# Patient Record
Sex: Male | Born: 1980 | Race: Asian | Hispanic: No | Marital: Married | Smoking: Former smoker
Health system: Southern US, Community
[De-identification: ages and names within clinical notes are randomized; demographics above are authoritative.]

## PROBLEM LIST (undated history)

## (undated) HISTORY — PX: LASIK: SHX215

---

## 2016-06-30 DIAGNOSIS — Z3189 Encounter for other procreative management: Secondary | ICD-10-CM | POA: Diagnosis not present

## 2016-07-28 DIAGNOSIS — Z3189 Encounter for other procreative management: Secondary | ICD-10-CM | POA: Diagnosis not present

## 2016-09-22 DIAGNOSIS — Z3189 Encounter for other procreative management: Secondary | ICD-10-CM | POA: Diagnosis not present

## 2017-03-16 DIAGNOSIS — J029 Acute pharyngitis, unspecified: Secondary | ICD-10-CM | POA: Diagnosis not present

## 2017-03-23 DIAGNOSIS — J209 Acute bronchitis, unspecified: Secondary | ICD-10-CM | POA: Diagnosis not present

## 2017-03-23 DIAGNOSIS — R05 Cough: Secondary | ICD-10-CM | POA: Diagnosis not present

## 2018-05-11 DIAGNOSIS — B349 Viral infection, unspecified: Secondary | ICD-10-CM | POA: Diagnosis not present

## 2018-07-25 ENCOUNTER — Emergency Department (HOSPITAL_COMMUNITY)
Admission: EM | Admit: 2018-07-25 | Discharge: 2018-07-25 | Disposition: A | Discharge status: Home / Self Care | Payer: BLUE CROSS/BLUE SHIELD | Attending: Emergency Medicine | Admitting: Emergency Medicine

## 2018-07-25 ENCOUNTER — Encounter (HOSPITAL_COMMUNITY): Payer: Self-pay | Admitting: *Deleted

## 2018-07-25 ENCOUNTER — Emergency Department (HOSPITAL_COMMUNITY): Payer: BLUE CROSS/BLUE SHIELD

## 2018-07-25 DIAGNOSIS — R05 Cough: Secondary | ICD-10-CM | POA: Insufficient documentation

## 2018-07-25 DIAGNOSIS — R059 Cough, unspecified: Secondary | ICD-10-CM

## 2018-07-25 MED ORDER — PSEUDOEPH-BROMPHEN-DM 30-2-10 MG/5ML PO SYRP: 5.0000 mL | ORAL_SOLUTION | Freq: Four times a day (QID) | ORAL | 0 refills | Status: AC | PRN

## 2018-07-25 NOTE — ED Notes (Signed)
Bed: [facility]2 Expected date:  Expected time:  Means of arrival:  Comments: 

## 2018-07-25 NOTE — Discharge Instructions (Addendum)
Your lungs are clear on your chest x-ray.  I am going to prescribe you some medication which should help.  I am going to give you information to follow-up with a primary care doctor for further work-up if the medication does not help.  Thank you for allowing me to care for you today. Please return to the emergency department if you have new or worsening symptoms. Take your medications as instructed.

## 2018-07-25 NOTE — ED Triage Notes (Signed)
Pt complains of productive cough x 3 weeks. Pt denies fever, shortness of breath.

## 2018-07-25 NOTE — ED Provider Notes (Signed)
[facility]-EMERGENCY DEPT Provider Note   CSN: [id number] Arrival date & time: 07/25/18  1728    History   Chief Complaint Chief Complaint  Patient presents with  . Cough    HPI Travis Patel is a 38 y.o. male.     Patient presents the emergency department for cough.  Patient has no past medical history.  Reports that he has had a cough for about 3 weeks.  Reports that he tried some over-the-counter medications but the cough persisted.  He reports that sometimes it is productive of some mucus but usually is dry.  Reports that he does not have a primary care doctor.  States that around this time every year he gets the same symptoms and has been diagnosed with bronchitis before.  He denies smoking.  Denies any fever, chills, shortness of breath, leg swelling, chest pain, sick contacts, recent travel.     History reviewed. No pertinent past medical history.  There are no active problems to display for this patient.   History reviewed. No pertinent surgical history.      Home Medications    Prior to Admission medications   Medication Sig Start Date End Date Taking? Authorizing Provider  brompheniramine-pseudoephedrine-DM 30-2-10 MG/5ML syrup Take 5 mLs by mouth 4 (four) times daily as needed for up to 7 days. 07/25/18 08/01/18  Arlyn Dunning, PA-C    Family History No family history on file.  Social History Social History   Tobacco Use  . Smoking status: Never Smoker  . Smokeless tobacco: Never Used  Substance Use Topics  . Alcohol use: Yes  . Drug use: Not on file     Allergies   Patient has no allergy information on record.   Review of Systems Review of Systems  Constitutional: Negative for chills and fever.  HENT: Negative for congestion, ear pain, postnasal drip, rhinorrhea, sinus pain and sore throat.   Eyes: Negative for pain and visual disturbance.  Respiratory: Positive for cough. Negative for shortness of breath.    Cardiovascular: Negative for chest pain and palpitations.  Gastrointestinal: Negative for abdominal pain and vomiting.  Genitourinary: Negative for dysuria and hematuria.  Musculoskeletal: Negative for arthralgias and back pain.  Skin: Negative for color change and rash.  Neurological: Negative for seizures and syncope.  All other systems reviewed and are negative.    Physical Exam Updated Vital Signs BP (!) 137/101 (BP Location: Right Arm)   Pulse 88   Temp 98.2 F (36.8 C) (Oral)   Resp 18   SpO2 98%   Physical Exam Vitals signs and nursing note reviewed.  Constitutional:      Appearance: Normal appearance.  HENT:     Head: Normocephalic.     Nose: Nose normal.     Mouth/Throat:     Mouth: Mucous membranes are moist.     Pharynx: Oropharynx is clear. No oropharyngeal exudate or posterior oropharyngeal erythema.  Eyes:     Conjunctiva/sclera: Conjunctivae normal.  Cardiovascular:     Rate and Rhythm: Normal rate and regular rhythm.  Pulmonary:     Effort: Pulmonary effort is normal. No respiratory distress.     Breath sounds: Normal breath sounds. No wheezing, rhonchi or rales.  Chest:     Chest wall: No tenderness.  Skin:    General: Skin is dry.  Neurological:     Mental Status: He is alert.  Psychiatric:        Mood and Affect: Mood normal.  ED Treatments / Results  Labs (all labs ordered are listed, but only abnormal results are displayed) Labs Reviewed - No data to display  EKG None  Radiology Dg Chest Portable 1 View  Result Date: 07/25/2018 CLINICAL DATA:  Cough for 3 weeks EXAM: PORTABLE CHEST 1 VIEW COMPARISON:  None. FINDINGS: The heart size and mediastinal contours are within normal limits. Both lungs are clear. The visualized skeletal structures are unremarkable. IMPRESSION: Clear lungs. Electronically Signed   By: Deatra RobinsonKevin  Herman M.D.   On: 07/25/2018 19:11    Procedures Procedures (including critical care time)  Medications Ordered in  ED Medications - No data to display   Initial Impression / Assessment and Plan / ED Course  I have reviewed the triage vital signs and the nursing notes.  Pertinent labs & imaging results that were available during my care of the patient were reviewed by me and considered in my medical decision making (see chart for details).  Clinical Course as of Jul 24 1920  Tue Jul 25, 2018  [id number] Patient has had cough for 2 to 3 weeks.  No concerning findings on exam.  Negative chest x-ray.  I think this may actually be seasonal allergies.  I am going to give him Bromfed.  Follow-up with primary care doctor for further work-up if cough persists.   [KM]    Clinical Course User Index [KM] Arlyn DunningMcLean, Christopher Hink A, PA-C       Based on review of vitals, medical screening exam, lab work and/or imaging, there does not appear to be an acute, emergent etiology for the patient's symptoms. Counseled pt on good return precautions and encouraged both PCP and ED follow-up as needed.  Prior to discharge, I also discussed incidental imaging findings with patient in detail and advised appropriate, recommended follow-up in detail.  Clinical Impression: 1. Cough     Disposition: Discharge  Prior to providing a prescription for a controlled substance, I independently reviewed the patient's recent prescription history on the [place] Controlled Substance Reporting System. The patient had no recent or regular prescriptions and was deemed appropriate for a brief, less than 3 day prescription of narcotic for acute analgesia.  This note was prepared with assistance of Conservation officer, historic buildingsDragon voice recognition software. Occasional wrong-word or sound-a-like substitutions may have occurred due to the inherent limitations of voice recognition software.   Final Clinical Impressions(s) / ED Diagnoses   Final diagnoses:  Cough    ED Discharge Orders         Ordered    brompheniramine-pseudoephedrine-DM 30-2-10 MG/5ML syrup  4 times  daily PRN     07/25/18 1921           Jeral PinchMcLean, Dason Mosley A, PA-C 07/25/18 Clover [id number]    Rees, Elizabeth, MD 07/25/18 [phone]

## 2018-08-09 NOTE — Progress Notes (Signed)
Patient ID: Travis Patel, male   DOB: [birth year], 38 y.o.   MRN: [id number] Virtual Visit via Telephone Note  I connected with Travis Patel on 08/10/18 at  1:30 PM EDT by telephone and verified that I am speaking with the correct person using two identifiers.   I discussed the limitations, risks, security and privacy concerns of performing an evaluation and management service by telephone and the availability of in person appointments. I also discussed with the patient that there may be a patient responsible charge related to this service. The patient expressed understanding and agreed to proceed.  Patient location:  home My Location:  [facility] office Persons on the call:  Myself and the patient   History of Present Illness: After being seen in the ED 07/25/2018 for cough.  Cough was initially non productive and now he is coughing up green and brown mucus.  Xray of chest was WNL.  No fever.  Cough has been present now for about 6 weeks.  No breathing difficulties.  From ED note: Based on review of vitals, medical screening exam, lab work and/or imaging, there does not appear to be an acute, emergent etiology for the patient's symptoms. Counseled pt on good return precautions and encouraged both PCP and ED follow-up as needed.  Prior to discharge, I also discussed incidental imaging findings with patient in detail and advised appropriate, recommended follow-up in detail.  Clinical Impression: 1. Cough     Disposition: Discharge  Prior to providing a prescription for a controlled substance, I independently reviewed the patient's recent prescription history on the [place] Controlled Substance Reporting System. The patient had no recent or regular prescriptions and was deemed appropriate for a brief, less than 3 day prescription of narcotic for acute analgesia.     Observations/Objective:  A&Ox3.  TP linear.  Speech is clear   Assessment and Plan: 1. Upper respiratory tract infection,  unspecified type Fluids, rest, respiratory care.  Keep qurantine guidelines.  Cover for atypicals - azithromycin (ZITHROMAX) 250 MG tablet; Take 2 today then 1 daily  Dispense: 6 tablet; Refill: 0 - benzonatate (TESSALON) 100 MG capsule; Take 2 capsules (200 mg total) by mouth 3 (three) times daily as needed for cough.  Dispense: 40 capsule; Refill: 0  2.  Hospital follow-up   Follow Up Instructions: F/up 4-6 weeks;  Sooner if needed   I discussed the assessment and treatment plan with the patient. The patient was provided an opportunity to ask questions and all were answered. The patient agreed with the plan and demonstrated an understanding of the instructions.   The patient was advised to call back or seek an in-person evaluation if the symptoms worsen or if the condition fails to improve as anticipated.  I provided 11 minutes of non-face-to-face time during this encounter.   Georgian Co, PA-C

## 2018-08-10 ENCOUNTER — Ambulatory Visit: Payer: BLUE CROSS/BLUE SHIELD | Attending: Family Medicine | Admitting: Physician Assistant

## 2018-08-10 ENCOUNTER — Other Ambulatory Visit: Payer: Self-pay

## 2018-08-10 DIAGNOSIS — J069 Acute upper respiratory infection, unspecified: Secondary | ICD-10-CM | POA: Diagnosis not present

## 2018-08-10 DIAGNOSIS — Z09 Encounter for follow-up examination after completed treatment for conditions other than malignant neoplasm: Secondary | ICD-10-CM | POA: Diagnosis not present

## 2018-08-10 MED ORDER — AZITHROMYCIN 250 MG PO TABS: ORAL_TABLET | ORAL | 0 refills | Status: DC

## 2018-08-10 MED ORDER — BENZONATATE 100 MG PO CAPS: 200.0000 mg | ORAL_CAPSULE | Freq: Three times a day (TID) | ORAL | 0 refills | Status: DC | PRN

## 2018-08-10 NOTE — Progress Notes (Signed)
Called patient to initiate their telephone visit with provider Georgian Co, PA. Verified date of birth. Patient still having c/o productive cough x 6 weeks. Worsening. Cough is worse at night. Started with clear mucus but has transitioned to a dark green/brown mucuc Occasionally notices blood in his phlegm. KWalker, CMA.

## 2018-08-18 ENCOUNTER — Telehealth: Payer: Self-pay | Admitting: General Practice

## 2018-08-18 NOTE — Telephone Encounter (Signed)
Travis Patel, could you call to set him up for WebEx visit on Monday please.

## 2018-08-18 NOTE — Telephone Encounter (Signed)
Patients call returned.  Patient identified by name and date of birth.  Patient states he has a cough because he has excessive mucous.  Patient states cough is intermittent depending on mucous build up.  Patient denies and medical conditions.  Patient states he weighs 155#.  Patient denies any other symptoms.  Patient denies any COVID-19 symptoms.  Patient made an appointment for Monday at 1030hrs  Patient acknowledged understanding of advice.

## 2018-08-18 NOTE — Telephone Encounter (Signed)
Patient called stating he finished his Azithromycin about 5-6 days ago but his condition remains the same. Patient states he does not feel well. Patient states he still has cough and mucus. Please follow up.

## 2018-08-21 ENCOUNTER — Ambulatory Visit: Payer: BLUE CROSS/BLUE SHIELD | Attending: Nurse Practitioner | Admitting: Nurse Practitioner

## 2018-08-21 ENCOUNTER — Other Ambulatory Visit: Payer: Self-pay

## 2018-09-15 ENCOUNTER — Encounter: Payer: Self-pay | Admitting: Nurse Practitioner

## 2018-09-15 ENCOUNTER — Ambulatory Visit: Payer: BC Managed Care – PPO | Attending: Nurse Practitioner | Admitting: Nurse Practitioner

## 2018-09-15 ENCOUNTER — Other Ambulatory Visit: Payer: Self-pay

## 2018-09-15 DIAGNOSIS — R053 Chronic cough: Secondary | ICD-10-CM

## 2018-09-15 DIAGNOSIS — R05 Cough: Secondary | ICD-10-CM

## 2018-09-15 MED ORDER — ALBUTEROL SULFATE HFA 108 (90 BASE) MCG/ACT IN AERS: 2.0000 | INHALATION_SPRAY | Freq: Four times a day (QID) | RESPIRATORY_TRACT | 2 refills | Status: DC | PRN

## 2018-09-15 NOTE — Progress Notes (Signed)
Virtual Visit via Telephone Note Due to national recommendations of social distancing due to COVID 19, telehealth visit is felt to be most appropriate for this patient at this time.  I discussed the limitations, risks, security and privacy concerns of performing an evaluation and management service by telephone and the availability of in person appointments. I also discussed with the patient that there may be a patient responsible charge related to this service. The patient expressed understanding and agreed to proceed.    I connected with Langston Reusing on 09/15/18  at   2:30 PM EDT  EDT by telephone and verified that I am speaking with the correct person using two identifiers.   Consent I discussed the limitations, risks, security and privacy concerns of performing an evaluation and management service by telephone and the availability of in person appointments. I also discussed with the patient that there may be a patient responsible charge related to this service. The patient expressed understanding and agreed to proceed.   Location of Patient: Private  Residence    Location of Provider: [facility]    Persons participating in Telemedicine visit: Bertram Denver FNP-BC YY [place] CMA Langston Reusing    History of Present Illness: Telemedicine visit for: Establish care and chronic cough He has never seen a PCP   Cough: Patient complains of nonproductive cough, productive cough and itchy throat.  Symptoms began 3 months ago.  The cough varies in regard to being non-productive, without wheezing, dyspnea or hemoptysis and sometimes productive of yellow brownish this sputum which turns to clear as the day progresses. Patient does not have new pets. Patient does not have a history of asthma. Patient does not have a history of environmental allergens. Patient has not had recent travel. Patient is a former smoker. Cough is worsened with lying down.  He has tried prilosec in the  past but only for one week. Other medications tried include flonase, OTC allergy medications.  Has taken claritin in the morning and zyrtec at night as well as benadryl with no relief of symptoms.  He was recently 08-10-2018 tried on tesssalon and zpack which was not effective.  He states to me that he has not experienced these symptoms before however ED note 07-25-2018 states "patient states that around this time every year he gets the same symptoms and has been diagnosed with bronchitis before".   Today he currently denies any fever, chills, shortness of breath, leg swelling, chest pain, sick contacts, recent travel. Ht 5'5 Wt. 150-165 BP Readings from Last 3 Encounters:  07/25/18 117/87   He is adamant that he does not have acid reflux or allergies even after I tried to explain that his symptoms could be related to either. States he has tried many medications for allergies and GERD and none of them have worked.      History reviewed. No pertinent past medical history.  Past Surgical History:  Procedure Laterality Date  . LASIK      Family History  Problem Relation Age of Onset  . Diabetes Neg Hx     Social History   Socioeconomic History  . Marital status: Married    Spouse name: Not on file  . Number of children: Not on file  . Years of education: Not on file  . Highest education level: Not on file  Occupational History  . Not on file  Social Needs  . Financial resource strain: Not on file  . Food insecurity:  Worry: Not on file    Inability: Not on file  . Transportation needs:    Medical: Not on file    Non-medical: Not on file  Tobacco Use  . Smoking status: Former Games developermoker  . Smokeless tobacco: Never Used  Substance and Sexual Activity  . Alcohol use: Not Currently  . Drug use: Not Currently  . Sexual activity: Not on file  Lifestyle  . Physical activity:    Days per week: Not on file    Minutes per session: Not on file  . Stress: Not on file  Relationships  .  Social connections:    Talks on phone: Not on file    Gets together: Not on file    Attends religious service: Not on file    Active member of club or organization: Not on file    Attends meetings of clubs or organizations: Not on file    Relationship status: Not on file  Other Topics Concern  . Not on file  Social History Narrative  . Not on file     Observations/Objective: Awake, alert and oriented x 3   Review of Systems  Constitutional: Negative for fever, malaise/fatigue and weight loss.  HENT: Negative.  Negative for nosebleeds.   Eyes: Negative.  Negative for blurred vision, double vision and photophobia.  Respiratory: Positive for cough. Negative for shortness of breath and wheezing.   Cardiovascular: Negative.  Negative for chest pain, palpitations and leg swelling.  Gastrointestinal: Negative.  Negative for abdominal pain, heartburn, nausea and vomiting.  Musculoskeletal: Negative.  Negative for myalgias.  Neurological: Negative.  Negative for dizziness, focal weakness, seizures and headaches.  Psychiatric/Behavioral: Negative.  Negative for suicidal ideas.    Assessment and Plan: Smitty CordsBruce was evaluated today for establish care.  Diagnoses and all orders for this visit:  Chronic cough -     albuterol (VENTOLIN HFA) 108 (90 Base) MCG/ACT inhaler; Inhale 2 puffs into the lungs every 6 (six) hours as needed for wheezing or shortness of breath.      Follow Up Instructions Return for f/u with Dr. Delford Fieldwright to evaluate for asthma. .     I discussed the assessment and treatment plan with the patient. The patient was provided an opportunity to ask questions and all were answered. The patient agreed with the plan and demonstrated an understanding of the instructions.   The patient was advised to call back or seek an in-person evaluation if the symptoms worsen or if the condition fails to improve as anticipated.  I provided 23 minutes of non-face-to-face time during this  encounter including median intraservice time, reviewing previous notes, labs, imaging, medications and explaining diagnosis and management.  Claiborne RiggZelda W Fleming, FNP-BC

## 2018-09-16 ENCOUNTER — Encounter: Payer: Self-pay | Admitting: Nurse Practitioner

## 2018-09-19 ENCOUNTER — Ambulatory Visit: Payer: BC Managed Care – PPO | Attending: Nurse Practitioner

## 2018-09-19 ENCOUNTER — Other Ambulatory Visit: Payer: Self-pay | Admitting: Nurse Practitioner

## 2018-09-19 ENCOUNTER — Other Ambulatory Visit: Payer: Self-pay

## 2018-09-19 DIAGNOSIS — Z0000 Encounter for general adult medical examination without abnormal findings: Secondary | ICD-10-CM

## 2018-09-19 DIAGNOSIS — R05 Cough: Secondary | ICD-10-CM

## 2018-09-19 DIAGNOSIS — R053 Chronic cough: Secondary | ICD-10-CM

## 2018-09-20 ENCOUNTER — Other Ambulatory Visit: Payer: Self-pay | Admitting: Nurse Practitioner

## 2018-09-20 LAB — CMP14+EGFR
ALT: 22 IU/L (ref 0–44)
AST: 21 IU/L (ref 0–40)
Albumin/Globulin Ratio: 1.9 (ref 1.2–2.2)
Albumin: 4.3 g/dL (ref 4.0–5.0)
Alkaline Phosphatase: 67 IU/L (ref 39–117)
BUN/Creatinine Ratio: 12 (ref 9–20)
BUN: 11 mg/dL (ref 6–20)
Bilirubin Total: 0.4 mg/dL (ref 0.0–1.2)
CO2: 24 mmol/L (ref 20–29)
Calcium: 9.2 mg/dL (ref 8.7–10.2)
Chloride: 100 mmol/L (ref 96–106)
Creatinine, Ser: 0.95 mg/dL (ref 0.76–1.27)
GFR calc Af Amer: 116 mL/min/1.73
GFR calc non Af Amer: 100 mL/min/1.73
Globulin, Total: 2.3 g/dL (ref 1.5–4.5)
Glucose: 89 mg/dL (ref 65–99)
Potassium: 4.4 mmol/L (ref 3.5–5.2)
Sodium: 140 mmol/L (ref 134–144)
Total Protein: 6.6 g/dL (ref 6.0–8.5)

## 2018-09-20 LAB — LIPID PANEL
Chol/HDL Ratio: 5.9 ratio — ABNORMAL HIGH (ref 0.0–5.0)
Cholesterol, Total: 214 mg/dL — ABNORMAL HIGH (ref 100–199)
HDL: 36 mg/dL — ABNORMAL LOW (ref >39)
LDL Calculated: 135 mg/dL — ABNORMAL HIGH (ref 0–99)
Triglycerides: 215 mg/dL — ABNORMAL HIGH (ref 0–149)
VLDL Cholesterol Cal: 43 mg/dL — ABNORMAL HIGH (ref 5–40)

## 2018-09-20 LAB — CBC WITH DIFFERENTIAL/PLATELET
Basophils Absolute: 0 10*3/uL (ref 0.0–0.2)
Basos: 1 %
EOS (ABSOLUTE): 0.2 10*3/uL (ref 0.0–0.4)
Eos: 3 %
Hematocrit: 46.9 % (ref 37.5–51.0)
Hemoglobin: 15.7 g/dL (ref 13.0–17.7)
Immature Grans (Abs): 0 10*3/uL (ref 0.0–0.1)
Immature Granulocytes: 0 %
Lymphocytes Absolute: 1.9 10*3/uL (ref 0.7–3.1)
Lymphs: 35 %
MCH: 30.1 pg (ref 26.6–33.0)
MCHC: 33.5 g/dL (ref 31.5–35.7)
MCV: 90 fL (ref 79–97)
Monocytes Absolute: 0.3 10*3/uL (ref 0.1–0.9)
Monocytes: 6 %
Neutrophils Absolute: 3 10*3/uL (ref 1.4–7.0)
Neutrophils: 55 %
Platelets: 203 10*3/uL (ref 150–450)
RBC: 5.22 x10E6/uL (ref 4.14–5.80)
RDW: 12.9 % (ref 11.6–15.4)
WBC: 5.4 10*3/uL (ref 3.4–10.8)

## 2018-09-20 MED ORDER — OMEPRAZOLE 20 MG PO CPDR: 20.0000 mg | DELAYED_RELEASE_CAPSULE | Freq: Every day | ORAL | 3 refills | Status: DC

## 2018-09-21 ENCOUNTER — Telehealth: Payer: Self-pay

## 2018-09-21 NOTE — Telephone Encounter (Signed)
CMA spoke to patient to inform on lab results and medication.  Pt. Verified DOB. Pt. Understood.

## 2018-09-21 NOTE — Telephone Encounter (Signed)
-----   Message from Gildardo Pounds, NP sent at 09/20/2018 10:37 AM EDT ----- Cholesterol levels are high. This likely means your diet is high in fat. Diets high in fat can also cause acid reflux which may be contributing to your cough. I am going to send in another medication for acid reflux. It should be taken 30 minutes prior to eating or taking any other medication. You should also take omega 3 1 capsule twice a day to help lower your cholesterol levels. If they continue to rise will need to start a prescription statin lowering medication. Increased cholesterol levels increase your risk of heart attack or stroke

## 2018-09-24 NOTE — Progress Notes (Signed)
Subjective:    Patient ID: Travis Patel, male    DOB: 11/02/1980, 38 y.o.   MRN: [id number]  This is a 38 year old Asian male with a history of chronic cough since the end of March.  At that time he had a viral type illness with sinus inflammation and upper airway irritation.  Since that time his cough has been quite persistent.  Is largely productive of brown and clear mucus.  It is worse at night and will awaken him from sleep.  He does note some postnasal drainage.  He does clear his throat quite a bit.  He tried omeprazole for 5 days and did not see a change in the cough.  He has been given an albuterol inhaler without much improvement.  He has had 2 rounds of antibiotics since early April including azithromycin and Levaquin without change.  He denies any sinus headache or sinus pressure.  Note the patient has had previous bronchitis in the past around the springtime area.  Note the patient also was given benzonatate and he did not take this regularly and it did not really improve much.  No he is also been on Benadryl and Zyrtec along with Flonase and he states this did not really help his symptom complex.  Note he is on omega-3 fish oil supplement but this was just recently started.  The patient denies any fever.  He denies any wheezing or chest tightness.  Please see cough assessment below    Cough This is a new problem. The current episode started more than 1 month ago. The problem occurs every few hours (worse at night). The cough is productive of sputum (brown yellow at night,  in AM  brown then later is white). Associated symptoms include postnasal drip. Pertinent negatives include no chest pain, ear congestion, ear pain, fever, headaches, heartburn, hemoptysis, nasal congestion, rash, rhinorrhea, sore throat, shortness of breath or wheezing. Associated symptoms comments: No clearing of throat Not hoarse, left ant chest pain Not chest tightness .    History reviewed. No pertinent past  medical history.   Family History  Problem Relation Age of Onset  . Diabetes Neg Hx      Social History   Socioeconomic History  . Marital status: Married    Spouse name: Not on file  . Number of children: Not on file  . Years of education: Not on file  . Highest education level: Not on file  Occupational History  . Not on file  Social Needs  . Financial resource strain: Not on file  . Food insecurity    Worry: Not on file    Inability: Not on file  . Transportation needs    Medical: Not on file    Non-medical: Not on file  Tobacco Use  . Smoking status: Former Research scientist (life sciences)  . Smokeless tobacco: Never Used  Substance and Sexual Activity  . Alcohol use: Not Currently  . Drug use: Not Currently  . Sexual activity: Not Currently  Lifestyle  . Physical activity    Days per week: Not on file    Minutes per session: Not on file  . Stress: Not on file  Relationships  . Social Herbalist on phone: Not on file    Gets together: Not on file    Attends religious service: Not on file    Active member of club or organization: Not on file    Attends meetings of clubs or organizations: Not on file  Relationship status: Not on file  . Intimate partner violence    Fear of current or ex partner: Not on file    Emotionally abused: Not on file    Physically abused: Not on file    Forced sexual activity: Not on file  Other Topics Concern  . Not on file  Social History Narrative  . Not on file     No Known Allergies   Outpatient Medications Prior to Visit  Medication Sig Dispense Refill  . omega-3 acid ethyl esters (LOVAZA) 1 g capsule Take 1 g by mouth daily.    Marland Kitchen. albuterol (VENTOLIN HFA) 108 (90 Base) MCG/ACT inhaler Inhale 2 puffs into the lungs every 6 (six) hours as needed for wheezing or shortness of breath. (Patient not taking: Reported on 09/25/2018) 1 Inhaler 2  . omeprazole (PRILOSEC) 20 MG capsule Take 1 capsule (20 mg total) by mouth daily for 30 days.  (Patient not taking: Reported on 09/25/2018) 30 capsule 3   No facility-administered medications prior to visit.      Review of Systems  Constitutional: Negative for fever.  HENT: Positive for postnasal drip. Negative for congestion, dental problem, drooling, ear discharge, ear pain, facial swelling, nosebleeds, rhinorrhea, sinus pressure, sinus pain, sneezing, sore throat, tinnitus, trouble swallowing and voice change.   Eyes: Negative.   Respiratory: Positive for cough. Negative for hemoptysis, choking, chest tightness, shortness of breath, wheezing and stridor.   Cardiovascular: Negative for chest pain, palpitations and leg swelling.  Gastrointestinal: Negative.  Negative for heartburn.  Endocrine: Negative.   Genitourinary: Negative.   Musculoskeletal: Negative.   Skin: Negative for rash.  Allergic/Immunologic: Negative.   Neurological: Negative for headaches.  Hematological: Negative.   Psychiatric/Behavioral: Negative.        Objective:   Physical Exam Vitals:   09/25/18 1124  BP: 106/69  Pulse: 68  Resp: 18  Temp: 98.8 F (37.1 C)  TempSrc: Oral  SpO2: 99%  Weight: 151 lb (68.5 kg)  Height: 5\' 6"  (1.676 m)    Gen: Pleasant, well-nourished, in no distress,  normal affect  ENT: Severe nasal turbinate edema bilaterally without nasal purulence mouth clear,  oropharynx clear, 3+ postnasal drip   Neck: No JVD, no TMG, no carotid bruits  Lungs: No use of accessory muscles, no dullness to percussion, clear without rales or rhonchi.  [place] pseudo-wheeze with forced exhalation in the mouth and open position  Cardiovascular: RRR, heart sounds normal, no murmur or gallops, no peripheral edema  Abdomen: soft and NT, no HSM,  BS normal No epigastric tenderness Musculoskeletal: No deformities, no cyanosis or clubbing  Neuro: alert, non focal  Skin: Warm, no lesions or rashes  All chest x-rays are normal in the epic system that been taken recently    Assessment &  Plan:  I personally reviewed all images and lab [facility] as well as any outside material available during this office visit and agree with the  radiology impressions.   Chronic cough Cyclic cough on a chronic basis secondary to upper airway instability and associated postnasal drip with chronic sinusitis and rhinitis likely allergic in nature, doubt active infection  Plan will be to administer prednisone 40 mg a day for 5 days, begin Flonase nasal spray 2 sprays each nostril daily, begin brompheniramine 12 mg twice daily, begin cyclic cough protocol with benzonatate 200 mg 3 times daily and as needed as well as nightly promethazine dextromethorphan 5 mL's  Note no antibiotics are indicated  We  will obtain complete diagnostic x-rays of the sinuses  I asked the patient to stop omega-3 for now as it may be exacerbating the cough  I also discontinued albuterol and omeprazole as I do not believe reflux or asthma are playing a role  Chronic sinusitis Based on direct exam I am suspecting chronic sinusitis but will hold on antibiotics for now   Duval was seen today for follow-up.  Diagnoses and all orders for this visit:  Chronic cough -     DG Sinuses Complete; Future  Chronic sinusitis, unspecified location -     DG Sinuses Complete; Future  Non-seasonal allergic rhinitis due to other allergic trigger  Other orders -     omega-3 acid ethyl esters (LOVAZA) 1 g capsule; HOLD FOR NOW -     predniSONE (DELTASONE) 10 MG tablet; Take 4 tablets daily for 5 days then stop -     promethazine-dextromethorphan (PROMETHAZINE-DM) 6.25-15 MG/5ML syrup; Take 5 ml  every night before bed -     fluticasone (FLONASE) 50 MCG/ACT nasal spray; Place 2 sprays into both nostrils daily. -     Brompheniramine Tannate 12 MG CHEW; Take one twice daily -     benzonatate (TESSALON) 200 MG capsule; Take 1 capsule (200 mg total) by mouth 3 (three) times daily.

## 2018-09-25 ENCOUNTER — Other Ambulatory Visit: Payer: Self-pay

## 2018-09-25 ENCOUNTER — Ambulatory Visit: Payer: BC Managed Care – PPO | Attending: Critical Care Medicine | Admitting: Critical Care Medicine

## 2018-09-25 ENCOUNTER — Encounter: Payer: Self-pay | Admitting: Critical Care Medicine

## 2018-09-25 VITALS — BP 106/69 | HR 68 | Temp 98.8°F | Resp 18 | Ht 66.0 in | Wt 151.0 lb

## 2018-09-25 DIAGNOSIS — J329 Chronic sinusitis, unspecified: Secondary | ICD-10-CM | POA: Diagnosis not present

## 2018-09-25 DIAGNOSIS — J3089 Other allergic rhinitis: Secondary | ICD-10-CM | POA: Diagnosis not present

## 2018-09-25 DIAGNOSIS — R05 Cough: Secondary | ICD-10-CM | POA: Diagnosis not present

## 2018-09-25 DIAGNOSIS — J309 Allergic rhinitis, unspecified: Secondary | ICD-10-CM | POA: Insufficient documentation

## 2018-09-25 DIAGNOSIS — R053 Chronic cough: Secondary | ICD-10-CM

## 2018-09-25 MED ORDER — PROMETHAZINE-DM 6.25-15 MG/5ML PO SYRP: ORAL_SOLUTION | ORAL | 0 refills | Status: DC

## 2018-09-25 MED ORDER — BROMPHENIRAMINE TANNATE 12 MG PO CHEW: CHEWABLE_TABLET | ORAL | 0 refills | Status: DC

## 2018-09-25 MED ORDER — BENZONATATE 200 MG PO CAPS: 200.0000 mg | ORAL_CAPSULE | Freq: Three times a day (TID) | ORAL | 1 refills | Status: AC

## 2018-09-25 MED ORDER — FLUTICASONE PROPIONATE 50 MCG/ACT NA SUSP: 2.0000 | Freq: Every day | NASAL | 6 refills | Status: AC

## 2018-09-25 MED ORDER — PREDNISONE 10 MG PO TABS: ORAL_TABLET | ORAL | 0 refills | Status: DC

## 2018-09-25 MED ORDER — OMEGA-3-ACID ETHYL ESTERS 1 G PO CAPS: ORAL_CAPSULE | ORAL | Status: AC

## 2018-09-25 NOTE — Assessment & Plan Note (Addendum)
Cyclic cough on a chronic basis secondary to upper airway instability and associated postnasal drip with chronic sinusitis and rhinitis likely allergic in nature, doubt active infection  Plan will be to administer prednisone 40 mg a day for 5 days, begin Flonase nasal spray 2 sprays each nostril daily, begin brompheniramine 12 mg twice daily, begin cyclic cough protocol with benzonatate 200 mg 3 times daily and as needed as well as nightly promethazine dextromethorphan 5 mL's  Note no antibiotics are indicated  We will obtain complete diagnostic x-rays of the sinuses  I asked the patient to stop omega-3 for now as it may be exacerbating the cough  I also discontinued albuterol and omeprazole as I do not believe reflux or asthma are playing a role

## 2018-09-25 NOTE — Assessment & Plan Note (Signed)
Based on direct exam I am suspecting chronic sinusitis but will hold on antibiotics for now

## 2018-09-25 NOTE — Patient Instructions (Addendum)
Stop over-the-counter omega-3  Hold Lovaza your prescription cholesterol medicine for now as it is also an omega-3  Begin prednisone 10 mg strength take 4 daily until gone for 5 days  Begin brompheniramine 1 twice daily, note your pharmacy may have to order this will provide a substitution  Use benzonatate 1    3 times a day on a regular basis  Begin Flonase 2 sprays each nostril daily  Take promethazine dextromethorphan every night 5 mL's before bedtime  Get sugar-free candy drops and keep these in your mouth and focus on swallowing instead of clearing your throat and coughing  An x-ray of your sinuses will be obtained  Return in follow-up 2 weeks

## 2018-09-26 ENCOUNTER — Ambulatory Visit (HOSPITAL_COMMUNITY)
Admission: RE | Admit: 2018-09-26 | Discharge: 2018-09-26 | Disposition: A | Discharge status: Home / Self Care | Payer: BC Managed Care – PPO | Source: Ambulatory Visit | Attending: Critical Care Medicine | Admitting: Critical Care Medicine

## 2018-09-26 DIAGNOSIS — J329 Chronic sinusitis, unspecified: Secondary | ICD-10-CM | POA: Diagnosis not present

## 2018-09-26 DIAGNOSIS — R05 Cough: Secondary | ICD-10-CM | POA: Insufficient documentation

## 2018-09-26 DIAGNOSIS — R053 Chronic cough: Secondary | ICD-10-CM

## 2018-09-26 DIAGNOSIS — J3489 Other specified disorders of nose and nasal sinuses: Secondary | ICD-10-CM | POA: Diagnosis not present

## 2018-09-27 NOTE — Telephone Encounter (Signed)
Called patient and LVM to return call and schedule a f/u appt with Dr. Joya Gaskins on 10/10/18.

## 2018-09-28 ENCOUNTER — Other Ambulatory Visit: Payer: Self-pay | Admitting: Family Medicine

## 2018-09-28 DIAGNOSIS — J329 Chronic sinusitis, unspecified: Secondary | ICD-10-CM

## 2018-10-01 ENCOUNTER — Telehealth: Payer: Self-pay | Admitting: Critical Care Medicine

## 2018-10-01 NOTE — Telephone Encounter (Signed)
Thank you Dr. Wright.

## 2018-10-01 NOTE — Telephone Encounter (Signed)
I Called this patient on 09/27/18 to let him know of his sinus xray result which shows R frontal and maxillary sinusitis acute on chronic  I called in 10days Augmentin 875mg  bid   He will continue all other therapies

## 2018-10-08 NOTE — Progress Notes (Signed)
Subjective:    Patient ID: Travis Patel, male    DOB: 01/27/1981, 38 y.o.   MRN: [id number]  This is a 38 year old Asian male with a history of chronic cough since the end of March.  At that time he had a viral type illness with sinus inflammation and upper airway irritation.  Since that time his cough has been quite persistent.  Is largely productive of brown and clear mucus.  It is worse at night and will awaken him from sleep.  He does note some postnasal drainage.  He does clear his throat quite a bit.  He tried omeprazole for 5 days and did not see a change in the cough.  He has been given an albuterol inhaler without much improvement.  He has had 2 rounds of antibiotics since early April including azithromycin and Levaquin without change.  He denies any sinus headache or sinus pressure.  Note the patient has had previous bronchitis in the past around the springtime area.  Note the patient also was given benzonatate and he did not take this regularly and it did not really improve much.  No he is also been on Benadryl and Zyrtec along with Flonase and he states this did not really help his symptom complex.  Note he is on omega-3 fish oil supplement but this was just recently started.  The patient denies any fever.  He denies any wheezing or chest tightness.  This patient was first seen as noted above 2 weeks ago and at that point we prescribed Augmentin and prednisone due to the fact that his diagnostic x-rays of the sinuses showed maxillary and frontal chronic sinusitis.  He also had the patient stop his omega-3 fish oil.  He was using Flonase and saline rinse along with an antihistamine.  The patient returns today in follow-up and his cough is improved.  He is only coughing at night.  He will often awaken at 2 AM with a coughing spell and have to spit up clear mucus.  He denies any fever.  There is no frontal sinus pressure or headaches.  He denies any ear complaints.  There is no reflux symptoms.   He is not short of breath as well.  He has finished his course of brompheniramine.  He still has the benzonatate and the promethazine dextromethorphan     Please see cough assessment below   Cough This is a new problem. The current episode started more than 1 month ago. The problem has been gradually improving. Episode frequency: only coughing at night, will have to go to spit out mucus. The cough is productive of sputum (brown yellow at night,  in AM  brown then later is white). Pertinent negatives include no chest pain, ear congestion, ear pain, fever, headaches, heartburn, hemoptysis, nasal congestion, postnasal drip, rash, rhinorrhea, sore throat, shortness of breath or wheezing. Associated symptoms comments: No clearing of throat Not hoarse, left ant chest pain Not chest tightness .    History reviewed. No pertinent past medical history.   Family History  Problem Relation Age of Onset  . Diabetes Neg Hx      Social History   Socioeconomic History  . Marital status: Married    Spouse name: Not on file  . Number of children: Not on file  . Years of education: Not on file  . Highest education level: Not on file  Occupational History  . Not on file  Social Needs  . Financial resource strain: Not on file  .  Food insecurity    Worry: Not on file    Inability: Not on file  . Transportation needs    Medical: Not on file    Non-medical: Not on file  Tobacco Use  . Smoking status: Former Games developermoker  . Smokeless tobacco: Never Used  Substance and Sexual Activity  . Alcohol use: Not Currently  . Drug use: Not Currently  . Sexual activity: Not Currently  Lifestyle  . Physical activity    Days per week: Not on file    Minutes per session: Not on file  . Stress: Not on file  Relationships  . Social Musicianconnections    Talks on phone: Not on file    Gets together: Not on file    Attends religious service: Not on file    Active member of club or organization: Not on file     Attends meetings of clubs or organizations: Not on file    Relationship status: Not on file  . Intimate partner violence    Fear of current or ex partner: Not on file    Emotionally abused: Not on file    Physically abused: Not on file    Forced sexual activity: Not on file  Other Topics Concern  . Not on file  Social History Narrative  . Not on file     No Known Allergies   Outpatient Medications Prior to Visit  Medication Sig Dispense Refill  . benzonatate (TESSALON) 200 MG capsule Take 1 capsule (200 mg total) by mouth 3 (three) times daily. 90 capsule 1  . fluticasone (FLONASE) 50 MCG/ACT nasal spray Place 2 sprays into both nostrils daily. 16 g 6  . promethazine-dextromethorphan (PROMETHAZINE-DM) 6.25-15 MG/5ML syrup Take 5 ml  every night before bed 118 mL 0  . omega-3 acid ethyl esters (LOVAZA) 1 g capsule HOLD FOR NOW (Patient not taking: Reported on 10/10/2018)    . amoxicillin-clavulanate (AUGMENTIN) 875-125 MG tablet     . Brompheniramine Tannate 12 MG CHEW Take one twice daily (Patient not taking: Reported on 10/10/2018) 60 each 0  . predniSONE (DELTASONE) 10 MG tablet Take 4 tablets daily for 5 days then stop (Patient not taking: Reported on 10/10/2018) 20 tablet 0   No facility-administered medications prior to visit.      Review of Systems  Constitutional: Negative for fever.  HENT: Negative for congestion, dental problem, drooling, ear discharge, ear pain, facial swelling, nosebleeds, postnasal drip, rhinorrhea, sinus pressure, sinus pain, sneezing, sore throat, tinnitus, trouble swallowing and voice change.   Eyes: Negative.   Respiratory: Positive for cough. Negative for hemoptysis, choking, chest tightness, shortness of breath, wheezing and stridor.   Cardiovascular: Negative for chest pain, palpitations and leg swelling.  Gastrointestinal: Negative.  Negative for heartburn.  Endocrine: Negative.   Genitourinary: Negative.   Musculoskeletal: Negative.   Skin:  Negative for rash.  Allergic/Immunologic: Negative.   Neurological: Negative for headaches.  Hematological: Negative.   Psychiatric/Behavioral: Negative.        Objective:   Physical Exam Vitals:   10/10/18 1029  BP: 121/77  Pulse: 74  Temp: 98.7 F (37.1 C)  TempSrc: Oral  SpO2: 98%  Weight: 152 lb 12.8 oz (69.3 kg)  Height: 5\' 6"  (1.676 m)    Gen: Pleasant, well-nourished, in no distress,  normal affect  ENT: Severe nasal turbinate edema bilaterally without nasal purulence mouth clear,  oropharynx clear, 2+ postnasal drip   Neck: No JVD, no TMG, no carotid bruits  Lungs: No use of  accessory muscles, no dullness to percussion, clear without rales or rhonchi.  Wheeze has now resolved  Cardiovascular: RRR, heart sounds normal, no murmur or gallops, no peripheral edema  Abdomen: soft and NT, no HSM,  BS normal No epigastric tenderness Musculoskeletal: No deformities, no cyanosis or clubbing  Neuro: alert, non focal  Skin: Warm, no lesions or rashes  Sinus x-ray showed chronic sinusitis in the right frontal and maxillary sinuses    Assessment & Plan:  I personally reviewed all images and lab data in the [facility] system as well as any outside material available during this office visit and agree with the  radiology impressions.   Chronic cough Chronic cyclic cough on the basis of chronic sinusitis in the frontal and maxillary areas  The patient has had a partial response to antibiotics and steroids  Plan will be to refill the cough medication continue the cough protocol  Plan will also be to obtain a CT scan of the sinuses and refer to otolaryngology for further assessment  The patient will continue the Flonase daily and saline rinse  Chronic sinusitis Review cough assessment  CT scan of the sinus and referral to ENT will be made   Prayan was seen today for cough.  Diagnoses and all orders for this visit:  Chronic cough -     Ambulatory referral to ENT   Chronic maxillary sinusitis -     Ambulatory referral to ENT  Other orders -     promethazine-dextromethorphan (PROMETHAZINE-DM) 6.25-15 MG/5ML syrup; Take 5 ml  every night before bed

## 2018-10-10 ENCOUNTER — Encounter: Payer: Self-pay | Admitting: Critical Care Medicine

## 2018-10-10 ENCOUNTER — Other Ambulatory Visit: Payer: Self-pay

## 2018-10-10 ENCOUNTER — Ambulatory Visit: Payer: BC Managed Care – PPO | Attending: Critical Care Medicine | Admitting: Critical Care Medicine

## 2018-10-10 ENCOUNTER — Telehealth: Payer: Self-pay | Admitting: *Deleted

## 2018-10-10 VITALS — BP 121/77 | HR 74 | Temp 98.7°F | Ht 66.0 in | Wt 152.8 lb

## 2018-10-10 DIAGNOSIS — R053 Chronic cough: Secondary | ICD-10-CM

## 2018-10-10 DIAGNOSIS — R05 Cough: Secondary | ICD-10-CM | POA: Diagnosis not present

## 2018-10-10 DIAGNOSIS — J320 Chronic maxillary sinusitis: Secondary | ICD-10-CM

## 2018-10-10 MED ORDER — PROMETHAZINE-DM 6.25-15 MG/5ML PO SYRP: ORAL_SOLUTION | ORAL | 0 refills | Status: AC

## 2018-10-10 NOTE — Assessment & Plan Note (Signed)
Chronic cyclic cough on the basis of chronic sinusitis in the frontal and maxillary areas  The patient has had a partial response to antibiotics and steroids  Plan will be to refill the cough medication continue the cough protocol  Plan will also be to obtain a CT scan of the sinuses and refer to otolaryngology for further assessment  The patient will continue the Flonase daily and saline rinse

## 2018-10-10 NOTE — Progress Notes (Signed)
2 wks f/u for cough and patient states it is better.

## 2018-10-10 NOTE — Assessment & Plan Note (Signed)
Review cough assessment  CT scan of the sinus and referral to ENT will be made

## 2018-10-10 NOTE — Telephone Encounter (Signed)
Called [employer] to get Prior Auth for patient CT that was ordered. Approval number is [id number]. Staff called  Imaging to sch patient CT and spoke with [place].   Patient appt is with: [facility] [place] (patient need to bring picture ID/Insurance Card/facial Mask and no other prep is needed). Their number is [phone].  Appointment Date and time is:  July 14th at 11:10 am arriving at 10:50 am.   Staff called patient and [facility] to call office and to get information.

## 2018-10-10 NOTE — Patient Instructions (Signed)
Continue to use the cough syrup before bedtime  Continue Flonase 2 sprays each nostril daily  Continue saline 3 to 4 sprays both nostrils 3 times a day  A CT scan of your sinuses will be obtained  A referral to ear nose and throat Dr. Melida Quitter will be made for the sinus condition  You may also continue use benzonatate as needed for the cough  Return in follow-up 1 month with Dr. Joya Gaskins

## 2018-10-17 ENCOUNTER — Ambulatory Visit: Payer: BC Managed Care – PPO | Admitting: Critical Care Medicine

## 2018-10-24 ENCOUNTER — Ambulatory Visit
Admission: RE | Admit: 2018-10-24 | Discharge: 2018-10-24 | Disposition: A | Discharge status: Home / Self Care | Payer: BC Managed Care – PPO | Source: Ambulatory Visit | Attending: Family Medicine | Admitting: Family Medicine

## 2018-10-24 DIAGNOSIS — J329 Chronic sinusitis, unspecified: Secondary | ICD-10-CM

## 2018-10-24 DIAGNOSIS — J324 Chronic pansinusitis: Secondary | ICD-10-CM | POA: Diagnosis not present

## 2018-11-07 ENCOUNTER — Telehealth: Payer: BC Managed Care – PPO | Admitting: Critical Care Medicine

## 2018-11-20 DIAGNOSIS — J342 Deviated nasal septum: Secondary | ICD-10-CM | POA: Diagnosis not present

## 2018-11-20 DIAGNOSIS — J324 Chronic pansinusitis: Secondary | ICD-10-CM | POA: Diagnosis not present

## 2018-12-26 DIAGNOSIS — K219 Gastro-esophageal reflux disease without esophagitis: Secondary | ICD-10-CM | POA: Diagnosis not present

## 2018-12-26 DIAGNOSIS — R05 Cough: Secondary | ICD-10-CM | POA: Diagnosis not present

## 2018-12-26 DIAGNOSIS — J320 Chronic maxillary sinusitis: Secondary | ICD-10-CM | POA: Diagnosis not present

## 2019-06-21 ENCOUNTER — Ambulatory Visit: Payer: BC Managed Care – PPO | Attending: Internal Medicine

## 2019-06-21 DIAGNOSIS — Z23 Encounter for immunization: Secondary | ICD-10-CM

## 2019-06-21 NOTE — Progress Notes (Signed)
   Covid-19 Vaccination Clinic  Name:  Travis Patel    MRN: [id number] DOB: 27-Jan-1981  06/21/2019  Travis Patel was observed post Covid-19 immunization for 15 minutes without incident. He was provided with Vaccine Information Sheet and instruction to access the V-Safe system.   Travis Patel was instructed to call 911 with any severe reactions post vaccine: Marland Kitchen Difficulty breathing  . Swelling of face and throat  . A fast heartbeat  . A bad rash all over body  . Dizziness and weakness   Immunizations Administered    Name Date Dose VIS Date Route   Pfizer COVID-19 Vaccine 06/21/2019  1:37 PM 0.3 mL 03/23/2019 Intramuscular   Manufacturer: [employer], [employer]   Lot: [id number]   NDC: [address]-5

## 2019-07-18 ENCOUNTER — Ambulatory Visit: Payer: BC Managed Care – PPO | Attending: Internal Medicine

## 2019-07-18 DIAGNOSIS — Z23 Encounter for immunization: Secondary | ICD-10-CM

## 2019-07-18 NOTE — Progress Notes (Signed)
   Covid-19 Vaccination Clinic  Name:  Travis Patel    MRN: [id number] DOB: 09/03/1980  07/18/2019  [name] was observed post Covid-19 immunization for 15 minutes without incident. He was provided with Vaccine Information Sheet and instruction to access the V-Safe system.   [name] was instructed to call 911 with any severe reactions post vaccine: Marland Kitchen Difficulty breathing  . Swelling of face and throat  . A fast heartbeat  . A bad rash all over body  . Dizziness and weakness   Immunizations Administered    Name Date Dose VIS Date Route   Pfizer COVID-19 Vaccine 07/18/2019  8:11 AM 0.3 mL 03/23/2019 Intramuscular   Manufacturer: [employer], [employer]   Lot: [id number]   NDC: [address]-0

## 2020-04-19 DIAGNOSIS — Z1152 Encounter for screening for COVID-19: Secondary | ICD-10-CM | POA: Diagnosis not present

## 2020-05-21 IMAGING — DX PORTABLE CHEST - 1 VIEW
1 series · 1 of 1 positions shown · non-contrast
Comparison: None.

CLINICAL DATA: Cough for 3 weeks

EXAM:
PORTABLE CHEST 1 VIEW

[chest ap]
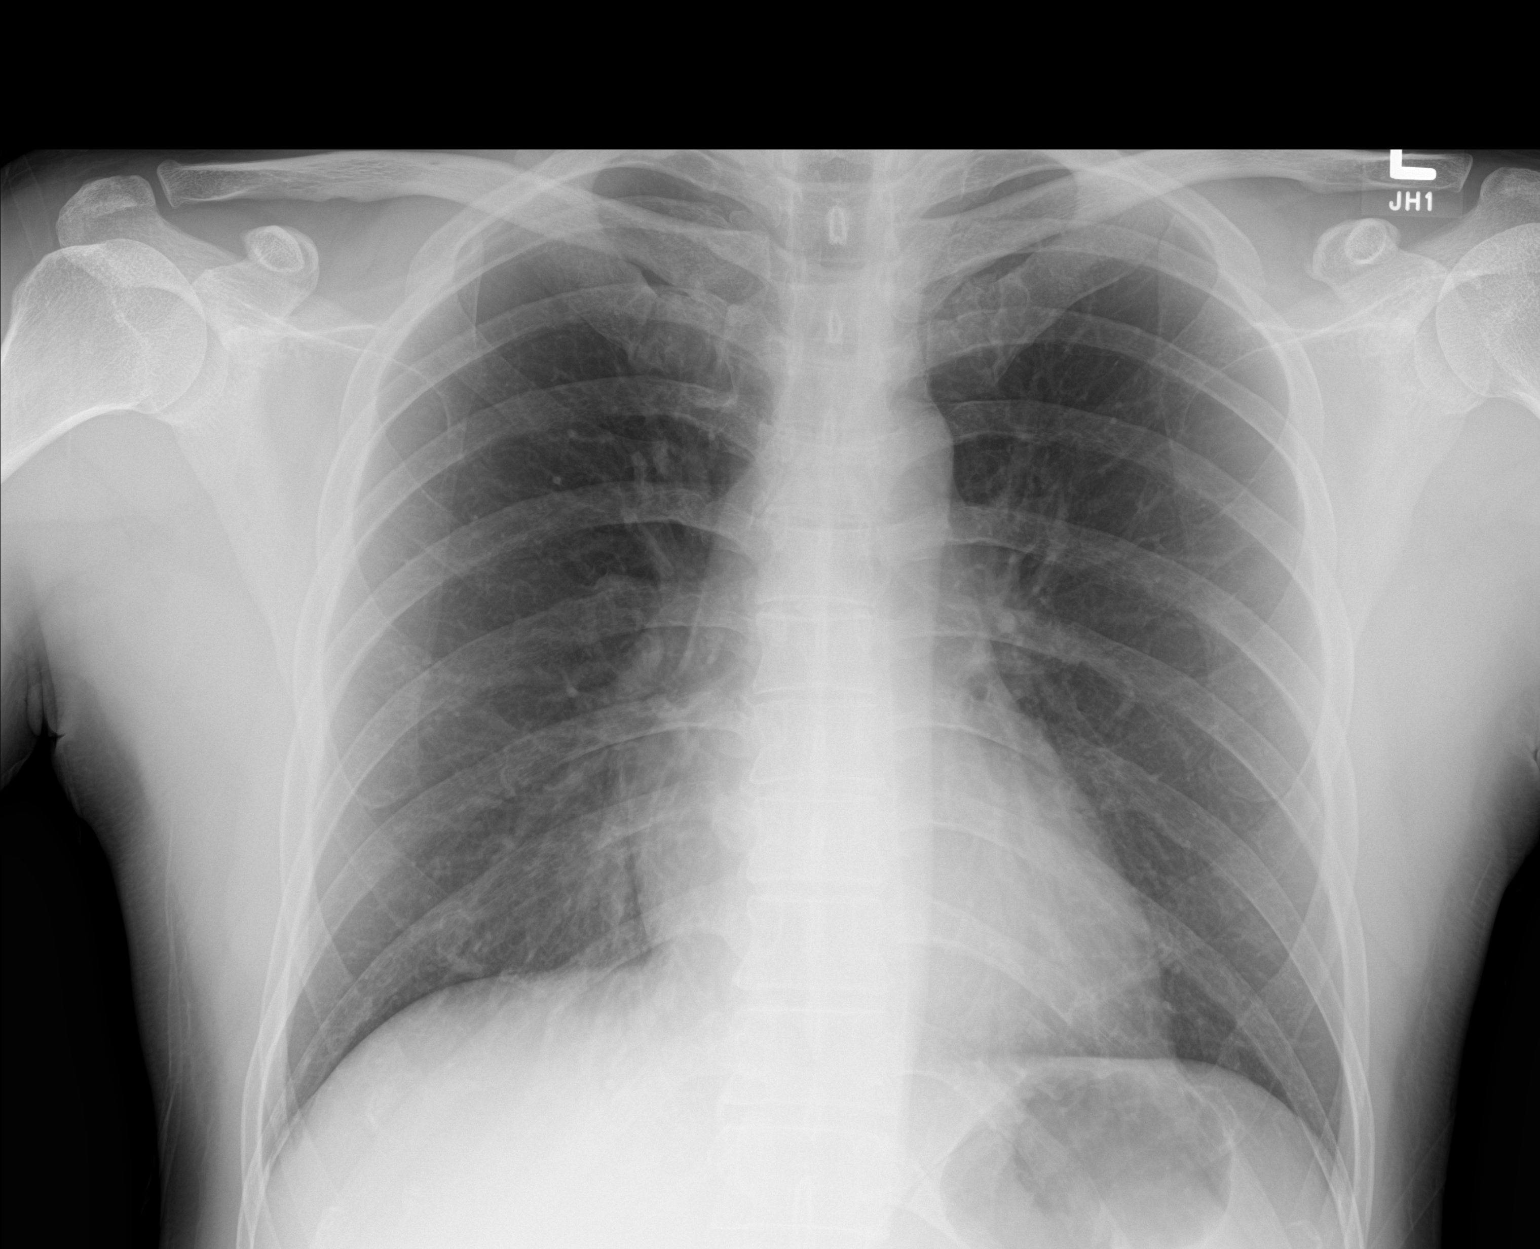

[1 of 1 positions shown; findings below may reference images not displayed]

FINDINGS: The heart size and mediastinal contours are within normal limits.
Both lungs are clear. The visualized skeletal structures are
unremarkable.
IMPRESSION: Clear lungs.

## 2020-08-20 IMAGING — CT CT MAXILLOFACIAL WITHOUT CONTRAST
1 series · 16 of 30 positions shown, 20 images · non-contrast
Comparison: Paranasal sinus radiographs dated 09/26/2018.

CLINICAL DATA: Recurrent chronic sinusitis for the past 4 months
with no improvement with antibiotics.

EXAM:
CT MAXILLOFACIAL WITHOUT CONTRAST
TECHNIQUE: Multidetector CT imaging of the maxillofacial structures was
performed. Multiplanar CT image reconstructions were also generated.

[Series 4: maxofacial soft · axial · 0.34mm/px · z∈[-218,-90]mm · 16 of 70 slices shown, 20 images]
[im 3/70  brain]
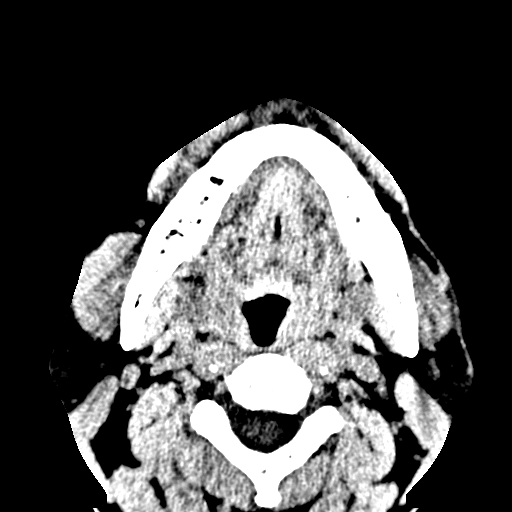
[im 3/70  bone]
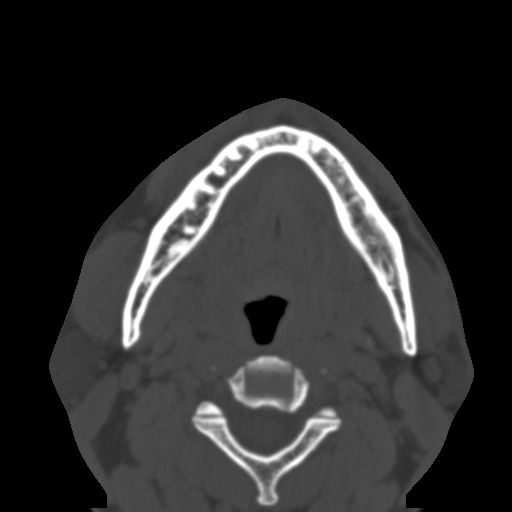
[im 8/70  bone]
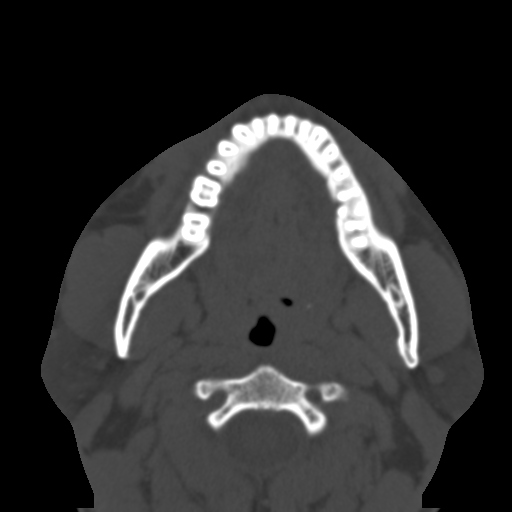
[im 15/70  bone]
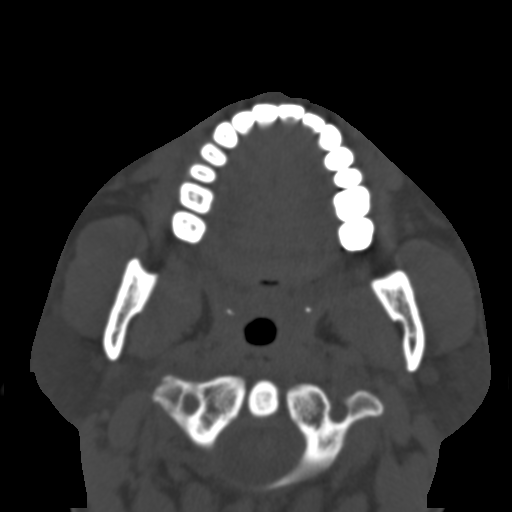
[im 17/70  bone]
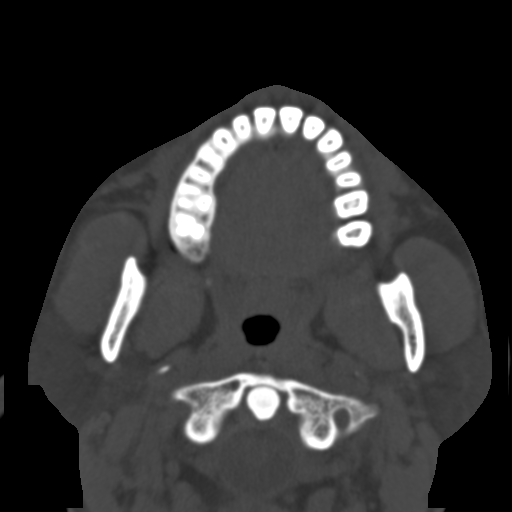
[im 22/70  brain]
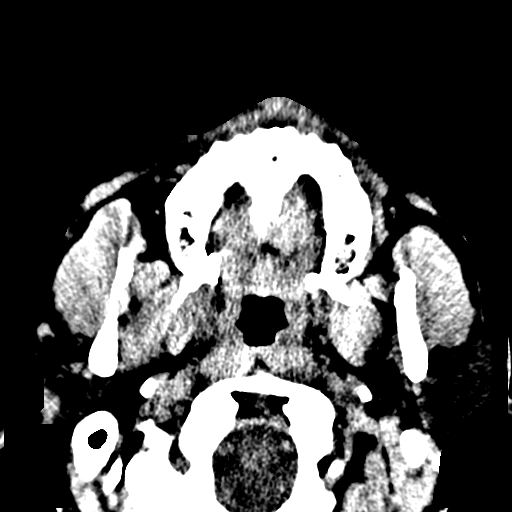
[im 22/70  bone]
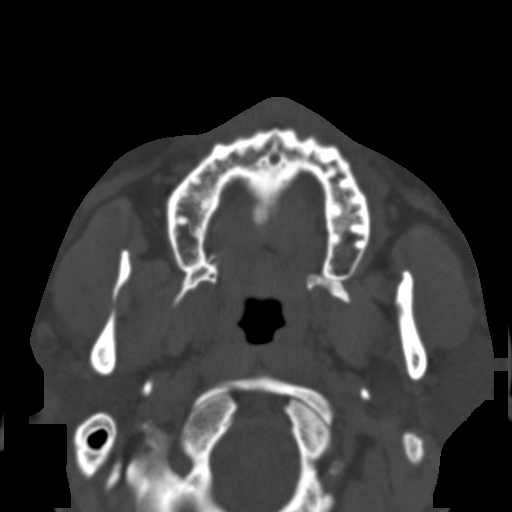
[im 27/70  bone]
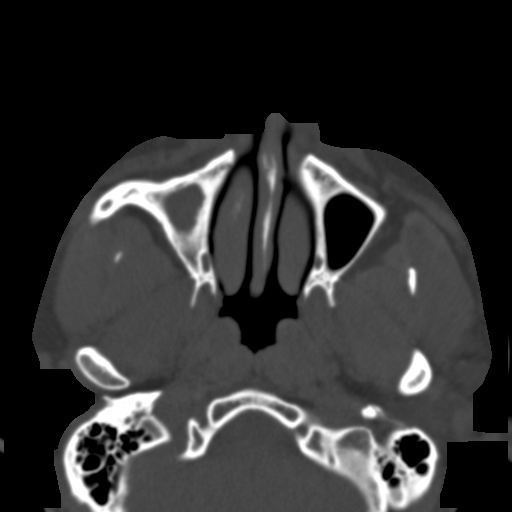
[im 29/70  bone]
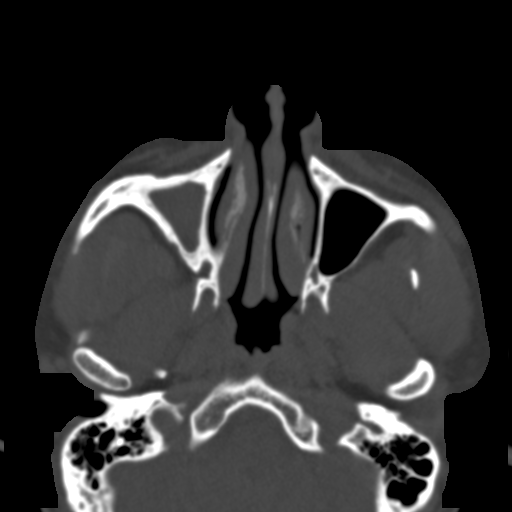
[im 34/70  bone]
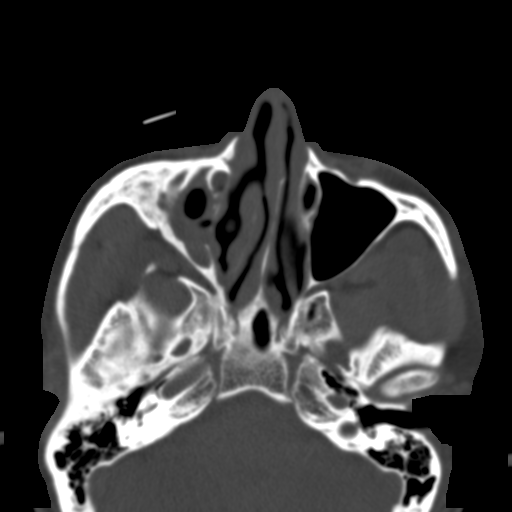
[im 39/70  brain]
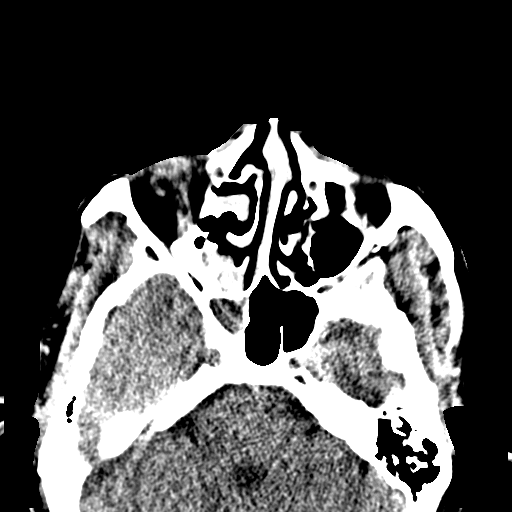
[im 39/70  bone]
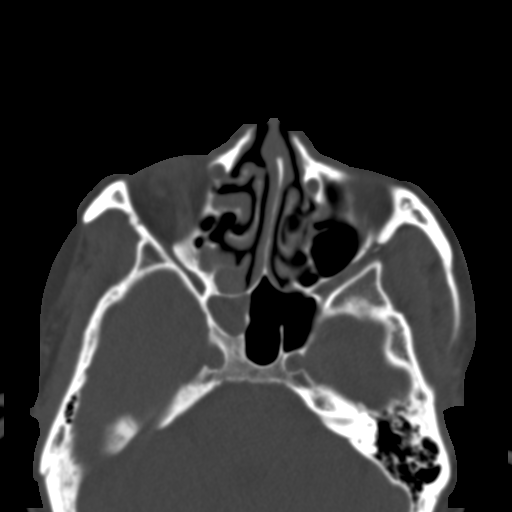
[im 43/70  bone]
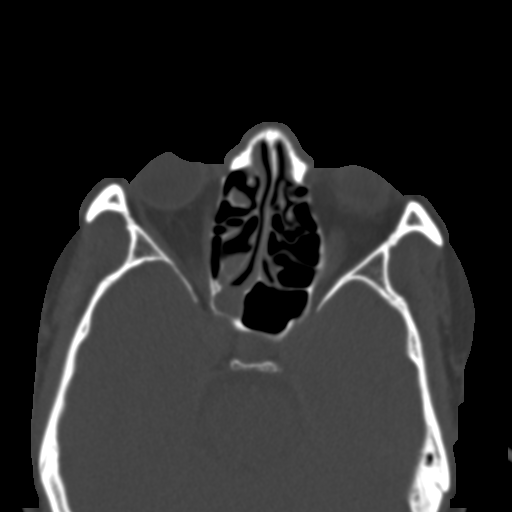
[im 46/70  bone]
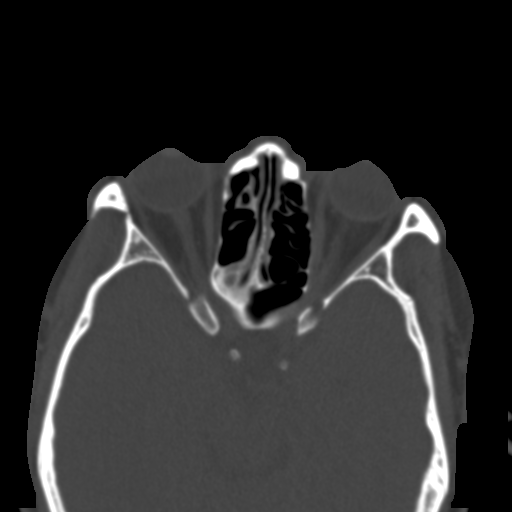
[im 50/70  bone]
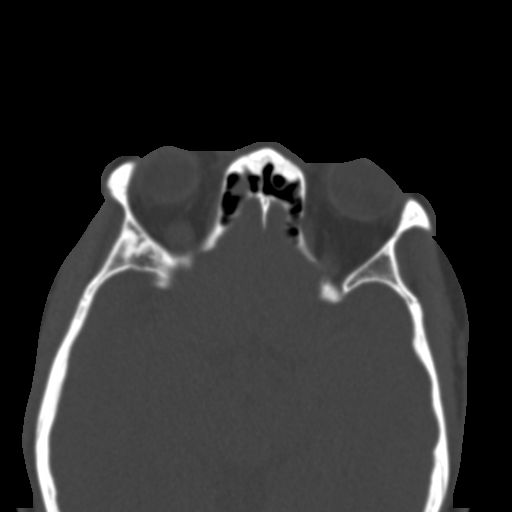
[im 55/70  brain]
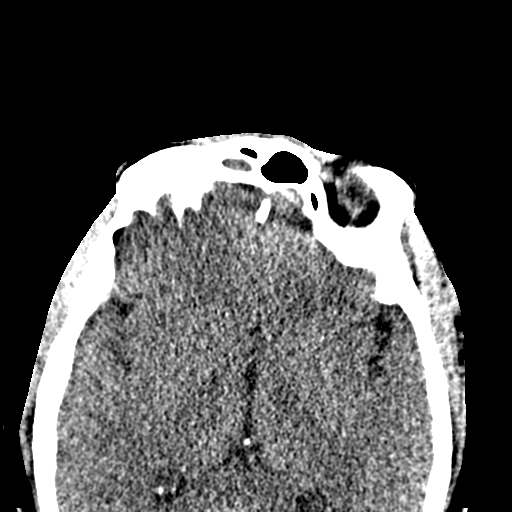
[im 55/70  bone]
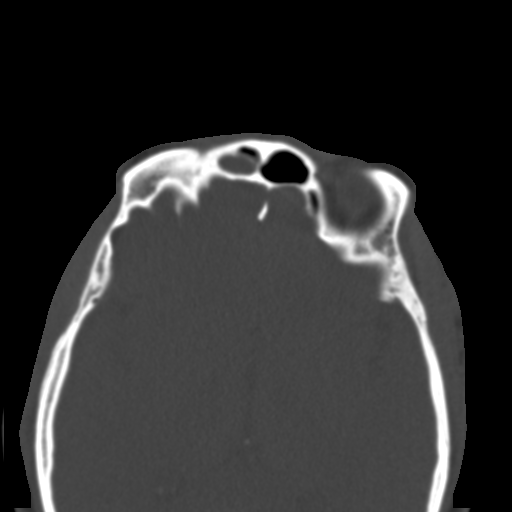
[im 58/70  bone]
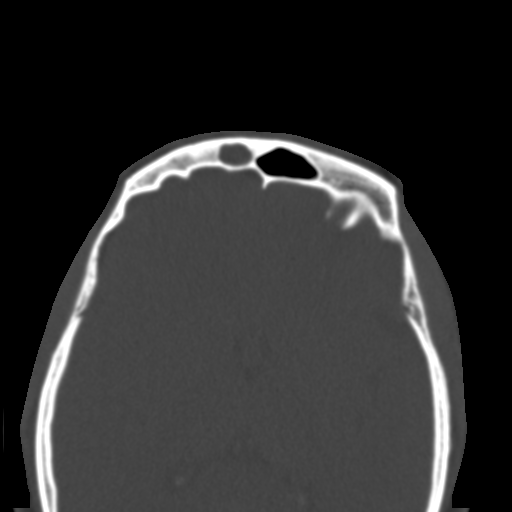
[im 62/70  bone]
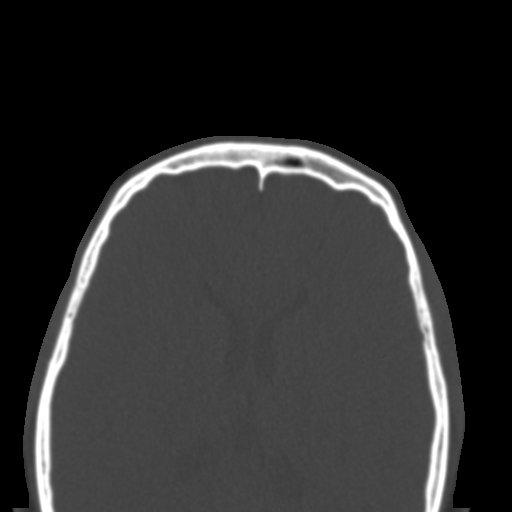
[im 67/70  bone]
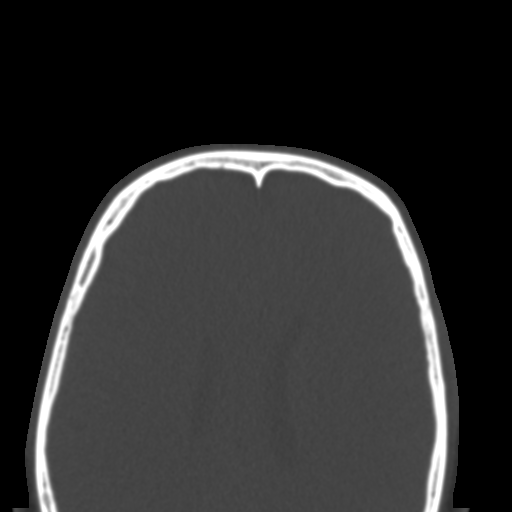

[16 of 30 positions shown; findings below may reference images not displayed]

FINDINGS: Osseous: No fracture or mandibular dislocation. No destructive
process.

Orbits: Negative. No traumatic or inflammatory finding.

Sinuses: Moderate right frontal, right ethmoid and right maxillary
sinus mucosal thickening. Complete opacification of the sphenoid
sinus on the right. No air-fluid levels are seen.

Soft tissues: Unremarkable.

Limited intracranial: No significant or unexpected finding.
IMPRESSION: Chronic pansinusitis on the right.
